# Patient Record
Sex: Male | Born: 1995 | Race: White | Hispanic: No | Marital: Single | Smoking: Current every day smoker
Health system: Southern US, Community
[De-identification: ages and names within clinical notes are randomized; demographics above are authoritative.]

## PROBLEM LIST (undated history)

## (undated) DIAGNOSIS — G4700 Insomnia, unspecified: Secondary | ICD-10-CM

## (undated) DIAGNOSIS — F39 Unspecified mood [affective] disorder: Secondary | ICD-10-CM

## (undated) DIAGNOSIS — T7840XA Allergy, unspecified, initial encounter: Secondary | ICD-10-CM

## (undated) HISTORY — DX: Allergy, unspecified, initial encounter: T78.40XA

## (undated) HISTORY — DX: Insomnia, unspecified: G47.00

## (undated) HISTORY — PX: OTHER SURGICAL HISTORY: SHX169

## (undated) HISTORY — PX: ADENOIDECTOMY: SHX5191

## (undated) HISTORY — DX: Unspecified mood (affective) disorder: F39

---
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
## 2016-01-25 ENCOUNTER — Encounter: Payer: Self-pay | Admitting: Family Medicine

## 2016-01-25 ENCOUNTER — Ambulatory Visit (INDEPENDENT_AMBULATORY_CARE_PROVIDER_SITE_OTHER): Payer: Managed Care, Other (non HMO) | Admitting: Family Medicine

## 2016-01-25 ENCOUNTER — Ambulatory Visit (INDEPENDENT_AMBULATORY_CARE_PROVIDER_SITE_OTHER): Payer: Managed Care, Other (non HMO)

## 2016-01-25 VITALS — BP 108/61 | HR 54 | Temp 98.4°F | Ht 73.5 in | Wt 200.0 lb

## 2016-01-25 DIAGNOSIS — M545 Low back pain, unspecified: Secondary | ICD-10-CM

## 2016-01-25 DIAGNOSIS — J45909 Unspecified asthma, uncomplicated: Secondary | ICD-10-CM | POA: Diagnosis not present

## 2016-01-25 DIAGNOSIS — F39 Unspecified mood [affective] disorder: Secondary | ICD-10-CM | POA: Diagnosis not present

## 2016-01-25 MED ORDER — ALBUTEROL SULFATE HFA 108 (90 BASE) MCG/ACT IN AERS: 1.0000 | INHALATION_SPRAY | Freq: Four times a day (QID) | RESPIRATORY_TRACT | Status: AC | PRN

## 2016-01-25 MED ORDER — CARBAMAZEPINE ER 100 MG PO CP12: 200.0000 mg | ORAL_CAPSULE | Freq: Two times a day (BID) | ORAL | Status: DC

## 2016-01-25 MED ORDER — RISPERIDONE 0.5 MG PO TABS: 0.5000 mg | ORAL_TABLET | Freq: Two times a day (BID) | ORAL | Status: DC

## 2016-01-25 MED ORDER — QUETIAPINE FUMARATE 25 MG PO TABS: 25.0000 mg | ORAL_TABLET | Freq: Every evening | ORAL | Status: DC | PRN

## 2016-01-25 NOTE — Addendum Note (Signed)
Addended by: Arville CareETTINGER, Adaja Wander on: 01/25/2016 10:19 AM   Modules accepted: Orders

## 2016-01-25 NOTE — Progress Notes (Addendum)
BP 108/61 mmHg  Pulse 54  Temp(Src) 98.4 F (36.9 C) (Oral)  Ht 6' 1.5" (1.867 m)  Wt 200 lb (90.719 kg)  BMI 26.03 kg/m2   Subjective:    Patient ID: Thomas Garrett, male    DOB: 1996-03-03, 20 y.o.   MRN: [id number]  HPI: Thomas Garrett is a 20 y.o. male presenting on 01/25/2016 for Establish Care   HPI Mood disorder Patient is coming in to establish care with our office today. He is coming in for a mood disorder. 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 He denies any suicidal ideations or thoughts of hurting himself. He is still having difficulty sleeping at night. He is currently taking carbamazepine and Risperdal and Seroquel. He does not have a psychiatrist currently but we will do a referral for one. He lives down in [place] so we will had done that direction. He says he has been a lot more stable on his medications and has been doing better now that he is away from where he was in [place] where he had the drug issues.  Mild reactive airways disease Over the past couple months he has had cough and what he feels like her wheezing is been using her albuterol inhaler. He typically has to use it about once a day. He denies having any asthma or breathing related problems when he was younger. He denies any fevers or chills or shortness of breath or wheezing. Is also had some intermittent nasal congestion and sinus congestion. This could be related to allergies but is not currently taking an allergy medication and recommended for him to go pick one up.  Low back pain midline Patient has midline low back pain that has been going on for the past few months. 8 comes and it goes in better and worsens. He says prolonged standing or prolonged sitting makes it worse. He doesn't know of anything that makes it better  at this point. He denies any fevers or chills or numbness or tingling going down either leg. He denies any overlying skin changes.  Relevant past medical, surgical, family and social history reviewed and updated as indicated. Interim medical history since our last visit reviewed. Allergies and medications reviewed and updated.  Review of Systems  Constitutional: Negative for fever.  HENT: Negative for congestion, ear discharge and ear pain.   Eyes: Negative for discharge and visual disturbance.  Respiratory: Positive for cough and wheezing. Negative for shortness of breath.   Cardiovascular: Negative for chest pain and leg swelling.  Gastrointestinal: Negative for abdominal pain, diarrhea and constipation.  Genitourinary: Negative for difficulty urinating.  Musculoskeletal: Positive for back pain and arthralgias. Negative for gait problem.  Skin: Negative for rash.  Neurological: Negative for dizziness, syncope, light-headedness and headaches.  Psychiatric/Behavioral: Positive for sleep disturbance, dysphoric mood and decreased concentration. Negative for suicidal ideas and self-injury. The patient is nervous/anxious.   All other systems reviewed and are negative.   Per HPI unless specifically indicated above  Social History   Social History  . Marital Status: Single    Spouse Name: N/A  . Number of Children: N/A  . Years of Education: N/A   Occupational History  . Not on file.   Social History Main Topics  . Smoking status: Former Smoker -- 1.00 packs/day for 4 years  Quit date: 12/08/2015  . Smokeless tobacco: Never Used  . Alcohol Use: No  . Drug Use: No     Comment: former addict cocaine, marijuana, alcohol, clean for 2 months  . Sexual Activity: Yes    Birth Control/ Protection: Condom   Other Topics Concern  . Not on file   Social History Narrative    Past Surgical History  Procedure Laterality Date  . Adenoidectomy    . Left leg      Family History    Problem Relation Age of Onset  . Cancer Maternal Grandmother     skin      Medication List       This list is accurate as of: 01/25/16  9:55 AM.  Always use your most recent med list.               albuterol 108 (90 Base) MCG/ACT inhaler  Commonly known as:  PROAIR HFA  Inhale 1 puff into the lungs every 6 (six) hours as needed for wheezing or shortness of breath.     carbamazepine 100 MG 12 hr capsule  Commonly known as:  CARBATROL  Take 2 capsules (200 mg total) by mouth 2 (two) times daily.     QUEtiapine 25 MG tablet  Commonly known as:  SEROQUEL  Take 1 tablet (25 mg total) by mouth at bedtime and may repeat dose one time if needed.     risperiDONE 0.5 MG tablet  Commonly known as:  RISPERDAL  Take 1 tablet (0.5 mg total) by mouth 2 (two) times daily.           Objective:    BP 108/61 mmHg  Pulse 54  Temp(Src) 98.4 F (36.9 C) (Oral)  Ht 6' 1.5" (1.867 m)  Wt 200 lb (90.719 kg)  BMI 26.03 kg/m2  Wt Readings from Last 3 Encounters:  01/25/16 200 lb (90.719 kg) (93 %*, Z = 1.51)  03/04/14 238 lb 12.8 oz (108.319 kg) (99 %*, Z = 2.49)  02/09/14 238 lb (107.956 kg) (99 %*, Z = 2.49)   * Growth percentiles are based on CDC 2-20 Years data.    Physical Exam  Constitutional: He is oriented to person, place, and time. He appears well-developed and well-nourished. No distress.  Eyes: Conjunctivae and EOM are normal. Pupils are equal, round, and reactive to light. Right eye exhibits no discharge. No scleral icterus.  Cardiovascular: Normal rate, regular rhythm, normal heart sounds and intact distal pulses.   No murmur heard. Pulmonary/Chest: Effort normal and breath sounds normal. No respiratory distress. He has no wheezes.  Musculoskeletal: Normal range of motion. He exhibits no edema.       Lumbar back: He exhibits tenderness (Midline low back pain around T12-L1. Negative straight leg raise bilaterally). He exhibits normal range of motion, no deformity and no  laceration.  Neurological: He is alert and oriented to person, place, and time. Coordination normal.  Skin: Skin is warm and dry. No rash noted. He is not diaphoretic.  Psychiatric: His speech is normal and behavior is normal. Judgment and thought content normal. His mood appears anxious. His affect is labile. He exhibits a depressed mood. He expresses no suicidal ideation. He expresses no suicidal plans.  Vitals reviewed.     Assessment & Plan:   Problem List Items Addressed This Visit      Respiratory   Mild reactive airways disease   Relevant Medications   albuterol (PROAIR HFA) 108 (90 Base) MCG/ACT inhaler  Other   Episodic mood disorder (HCC) - Primary   Relevant Medications   risperiDONE (RISPERDAL) 0.5 MG tablet   QUEtiapine (SEROQUEL) 25 MG tablet   carbamazepine (CARBATROL) 100 MG 12 hr capsule   Other Relevant Orders   Ambulatory referral to Psychiatry    Other Visit Diagnoses    Midline low back pain without sciatica        Relevant Orders    DG Lumbar Spine 2-3 Views        Follow up plan: Return in about 4 weeks (around 02/22/2016), or if symptoms worsen or fail to improve, for recheck anxiety.  Arville Care, MD [facility] 01/25/2016, 9:55 AM

## 2016-02-21 ENCOUNTER — Other Ambulatory Visit: Payer: Self-pay | Admitting: Family Medicine

## 2016-02-25 ENCOUNTER — Ambulatory Visit: Payer: Managed Care, Other (non HMO) | Admitting: Family Medicine

## 2016-03-04 ENCOUNTER — Ambulatory Visit (INDEPENDENT_AMBULATORY_CARE_PROVIDER_SITE_OTHER): Payer: Managed Care, Other (non HMO) | Admitting: Family Medicine

## 2016-03-04 ENCOUNTER — Encounter: Payer: Self-pay | Admitting: Family Medicine

## 2016-03-04 ENCOUNTER — Other Ambulatory Visit: Payer: Self-pay | Admitting: Family Medicine

## 2016-03-04 VITALS — BP 101/64 | HR 70 | Temp 97.0°F | Ht 73.5 in | Wt 201.4 lb

## 2016-03-04 DIAGNOSIS — F39 Unspecified mood [affective] disorder: Secondary | ICD-10-CM

## 2016-03-04 DIAGNOSIS — J309 Allergic rhinitis, unspecified: Secondary | ICD-10-CM

## 2016-03-04 DIAGNOSIS — F341 Dysthymic disorder: Secondary | ICD-10-CM | POA: Diagnosis not present

## 2016-03-04 DIAGNOSIS — F329 Major depressive disorder, single episode, unspecified: Secondary | ICD-10-CM

## 2016-03-04 MED ORDER — QUETIAPINE FUMARATE 100 MG PO TABS: 100.0000 mg | ORAL_TABLET | Freq: Every day | ORAL | Status: DC

## 2016-03-04 MED ORDER — CARBAMAZEPINE ER 100 MG PO CP12: ORAL_CAPSULE | ORAL | Status: AC

## 2016-03-04 MED ORDER — FLUTICASONE PROPIONATE 50 MCG/ACT NA SUSP: 2.0000 | Freq: Every day | NASAL | Status: AC

## 2016-03-04 NOTE — Progress Notes (Signed)
BP 101/64 mmHg  Pulse 70  Temp(Src) 97 F (36.1 C) (Oral)  Ht 6' 1.5" (1.867 m)  Wt 201 lb 6.4 oz (91.354 kg)  BMI 26.21 kg/m2  SpO2 99%   Subjective:    Patient ID: Thomas Garrett, male    DOB: 08/13/1996, 20 y.o.   MRN: [id number]  HPI: Thomas Garrett is a 20 y.o. male presenting on 03/04/2016 for Depression and GAD   HPI Depression and mood disorder Patient is coming in today for recheck on his depression and mood disorder. He is still in the intensive outpatient treatment and goes 5 days a week. 3 of those days he goes for group meetings and the other 2 he has one-on-one meetings. He says he has been feeling more depressed over the past few weeks. He does have an appointment to go see a psychiatrist in one month and was just coming back for refills during this meantime. He denies any suicidal ideations or thoughts of hurting himself. He has been having a lot of feelings of down and sadness. He denies any issues with sleeping or appetite.  Relevant past medical, surgical, family and social history reviewed and updated as indicated. Interim medical history since our last visit reviewed. Allergies and medications reviewed and updated.  Review of Systems  Constitutional: Negative for fever and chills.  HENT: Negative for ear discharge and ear pain.   Eyes: Negative for discharge and visual disturbance.  Respiratory: Negative for shortness of breath and wheezing.   Cardiovascular: Negative for chest pain and leg swelling.  Gastrointestinal: Negative for abdominal pain, diarrhea and constipation.  Genitourinary: Negative for difficulty urinating.  Musculoskeletal: Negative for back pain and gait problem.  Skin: Negative for rash.  Neurological: Negative for syncope, light-headedness and headaches.  Psychiatric/Behavioral: Positive for dysphoric mood. Negative for suicidal ideas, sleep disturbance, self-injury and decreased concentration. The patient is nervous/anxious.     All other systems reviewed and are negative.   Per HPI unless specifically indicated above     Medication List       This list is accurate as of: 03/04/16  9:45 AM.  Always use your most recent med list.               albuterol 108 (90 Base) MCG/ACT inhaler  Commonly known as:  PROAIR HFA  Inhale 1 puff into the lungs every 6 (six) hours as needed for wheezing or shortness of breath.     carbamazepine 100 MG 12 hr capsule  Commonly known as:  CARBATROL  TAKE 2 CAPSULES(200 MG) BY MOUTH TWICE DAILY     fluticasone 50 MCG/ACT nasal spray  Commonly known as:  FLONASE  Place 2 sprays into both nostrils daily.     QUEtiapine 100 MG tablet  Commonly known as:  SEROQUEL  Take 1 tablet (100 mg total) by mouth at bedtime.     risperiDONE 0.5 MG tablet  Commonly known as:  RISPERDAL  Take 1 tablet (0.5 mg total) by mouth 2 (two) times daily.           Objective:    BP 101/64 mmHg  Pulse 70  Temp(Src) 97 F (36.1 C) (Oral)  Ht 6' 1.5" (1.867 m)  Wt 201 lb 6.4 oz (91.354 kg)  BMI 26.21 kg/m2  SpO2 99%  Wt Readings from Last 3 Encounters:  03/04/16 201 lb 6.4 oz (91.354 kg) (94 %*, Z = 1.53)  01/25/16 200 lb (90.719 kg) (93 %*, Z = 1.51)  03/04/14 238 lb 12.8 oz (108.319 kg) (99 %*, Z = 2.49)   * Growth percentiles are based on CDC 2-20 Years data.    Physical Exam  Constitutional: He is oriented to person, place, and time. He appears well-developed and well-nourished. No distress.  Eyes: Conjunctivae and EOM are normal. Pupils are equal, round, and reactive to light. Right eye exhibits no discharge. No scleral icterus.  Neck: Neck supple. No thyromegaly present.  Cardiovascular: Normal rate, regular rhythm, normal heart sounds and intact distal pulses.   No murmur heard. Pulmonary/Chest: Effort normal and breath sounds normal. No respiratory distress. He has no wheezes.  Musculoskeletal: Normal range of motion. He exhibits no edema.  Lymphadenopathy:    He has  no cervical adenopathy.  Neurological: He is alert and oriented to person, place, and time. Coordination normal.  Skin: Skin is warm and dry. No rash noted. He is not diaphoretic.  Psychiatric: His behavior is normal. Judgment and thought content normal. His mood appears anxious. His affect is labile. He exhibits a depressed mood. He expresses no suicidal ideation. He expresses no suicidal plans.  Nursing note and vitals reviewed.     Assessment & Plan:   Problem List Items Addressed This Visit      Other   Depression, reactive   Relevant Medications   carbamazepine (CARBATROL) 100 MG 12 hr capsule   Episodic mood disorder (HCC) - Primary    Other Visit Diagnoses    Allergic rhinitis, unspecified allergic rhinitis type        Relevant Medications    fluticasone (FLONASE) 50 MCG/ACT nasal spray        Follow up plan: Return if symptoms worsen or fail to improve.  Counseling provided for all of the vaccine components No orders of the defined types were placed in this encounter.    Arville CareJoshua Deziya Amero, MD [facility] 03/04/2016, 9:45 AM

## 2016-03-11 ENCOUNTER — Other Ambulatory Visit: Payer: Self-pay | Admitting: Family Medicine

## 2016-03-21 ENCOUNTER — Ambulatory Visit: Payer: Managed Care, Other (non HMO) | Admitting: Family Medicine

## 2016-04-07 ENCOUNTER — Other Ambulatory Visit: Payer: Self-pay | Admitting: Family Medicine

## 2016-04-07 DIAGNOSIS — F39 Unspecified mood [affective] disorder: Secondary | ICD-10-CM

## 2016-04-30 ENCOUNTER — Telehealth: Payer: Self-pay | Admitting: Family Medicine

## 2016-04-30 DIAGNOSIS — F39 Unspecified mood [affective] disorder: Secondary | ICD-10-CM

## 2016-05-02 NOTE — Telephone Encounter (Signed)
We can go ahead and put in the referral again, we did try to put in the referral but then the patient when they received a call said he did not want to go the first time. But yes go ahead and put it back in and we'll try again.

## 2016-05-02 NOTE — Telephone Encounter (Signed)
[name] aware - referral ordered for Dr Sandria ManlyLove

## 2016-06-16 ENCOUNTER — Ambulatory Visit (INDEPENDENT_AMBULATORY_CARE_PROVIDER_SITE_OTHER): Payer: Managed Care, Other (non HMO) | Admitting: Family Medicine

## 2016-06-16 ENCOUNTER — Encounter: Payer: Self-pay | Admitting: Family Medicine

## 2016-06-16 VITALS — BP 112/64 | HR 75 | Temp 99.5°F | Ht 73.5 in | Wt 211.5 lb

## 2016-06-16 DIAGNOSIS — A09 Infectious gastroenteritis and colitis, unspecified: Secondary | ICD-10-CM

## 2016-06-16 DIAGNOSIS — R197 Diarrhea, unspecified: Secondary | ICD-10-CM

## 2016-06-16 MED ORDER — CIPROFLOXACIN HCL 500 MG PO TABS: 500.0000 mg | ORAL_TABLET | Freq: Two times a day (BID) | ORAL | 0 refills | Status: AC

## 2016-06-16 NOTE — Progress Notes (Signed)
BP 112/64   Pulse 75   Temp 99.5 F (37.5 C) (Oral)   Ht 6' 1.5" (1.867 m)   Wt 211 lb 8 oz (95.9 kg)   BMI 27.53 kg/m    Subjective:    Patient ID: Thomas Garrett, male    DOB: 1996-01-20, 20 y.o.   MRN: [id number]  HPI: Thomas Garrett is a 20 y.o. male presenting on 06/16/2016 for Diarrhea & Nausea (x 4 days; taking Pepto Bismol)   HPI Diarrhea and nausea Patient has been having diarrhea and nausea and is been going on for the past 4 days. He says he is having looser stools about 4-6 times daily and then he has been having some nausea but no vomiting and just has not really felt like eating. He has been trying to make sure that he stays hydrated well. He has tried Pepto-Bismol over the past 4 days but does not seem to be helping. He denies any fevers or chills although he does have a temperature of 99.5. The office today. He denies any sick contacts that he knows of or any recent travel. He denies any blood in his stool and his abdominal pain has been lower abdominal and dull and achy and low-grade.  Relevant past medical, surgical, family and social history reviewed and updated as indicated. Interim medical history since our last visit reviewed. Allergies and medications reviewed and updated.  Review of Systems  Constitutional: Negative for chills and fever.  Eyes: Negative for discharge.  Respiratory: Negative for shortness of breath and wheezing.   Cardiovascular: Negative for chest pain and leg swelling.  Gastrointestinal: Positive for abdominal pain, diarrhea and nausea. Negative for abdominal distention, anal bleeding, blood in stool, constipation and vomiting.  Genitourinary: Negative for decreased urine volume, difficulty urinating, dysuria, frequency, genital sores and urgency.  Musculoskeletal: Negative for back pain and gait problem.  Skin: Negative for rash.  All other systems reviewed and are negative.   Per HPI unless specifically indicated above       Medication List       Accurate as of 06/16/16  3:12 PM. Always use your most recent med list.          albuterol 108 (90 Base) MCG/ACT inhaler Commonly known as:  PROAIR HFA Inhale 1 puff into the lungs every 6 (six) hours as needed for wheezing or shortness of breath.   carbamazepine 100 MG 12 hr capsule Commonly known as:  CARBATROL TAKE 2 CAPSULES(200 MG) BY MOUTH TWICE DAILY   ciprofloxacin 500 MG tablet Commonly known as:  CIPRO Take 1 tablet (500 mg total) by mouth 2 (two) times daily.   fluticasone 50 MCG/ACT nasal spray Commonly known as:  FLONASE Place 2 sprays into both nostrils daily.   QUEtiapine 100 MG tablet Commonly known as:  SEROQUEL TAKE 1 TABLET(100 MG) BY MOUTH AT BEDTIME   QUEtiapine 25 MG tablet Commonly known as:  SEROQUEL TAKE 1 TABLET(25 MG) BY MOUTH AT BEDTIME. MAY REPEAT DOSE 1 TIME AS NEEDED   risperiDONE 0.5 MG tablet Commonly known as:  RISPERDAL TAKE 1 TABLET(0.5 MG) BY MOUTH TWICE DAILY          Objective:    BP 112/64   Pulse 75   Temp 99.5 F (37.5 C) (Oral)   Ht 6' 1.5" (1.867 m)   Wt 211 lb 8 oz (95.9 kg)   BMI 27.53 kg/m   Wt Readings from Last 3 Encounters:  06/16/16 211 lb 8 oz (  95.9 kg) (96 %, Z= 1.73)*  03/04/16 201 lb 6.4 oz (91.4 kg) (94 %, Z= 1.53)*  01/25/16 200 lb (90.7 kg) (93 %, Z= 1.51)*   * Growth percentiles are based on CDC 2-20 Years data.    Physical Exam  Constitutional: He is oriented to person, place, and time. He appears well-developed and well-nourished. No distress.  Eyes: Conjunctivae are normal. Right eye exhibits no discharge. No scleral icterus.  Cardiovascular: Normal rate, regular rhythm, normal heart sounds and intact distal pulses.   No murmur heard. Pulmonary/Chest: Effort normal and breath sounds normal. No respiratory distress. He has no wheezes. He has no rales.  Abdominal: Soft. Bowel sounds are normal. He exhibits no distension. There is no hepatosplenomegaly. There is tenderness  (Suprapubic abdominal pain) in the suprapubic area. There is no rigidity, no rebound, no guarding and no CVA tenderness.  Musculoskeletal: Normal range of motion. He exhibits no edema.  Neurological: He is alert and oriented to person, place, and time. Coordination normal.  Skin: Skin is warm and dry. No rash noted. He is not diaphoretic.  Psychiatric: He has a normal mood and affect. His behavior is normal.  Nursing note and vitals reviewed.     Assessment & Plan:   Problem List Items Addressed This Visit    None    Visit Diagnoses    Diarrhea of presumed infectious origin    -  Primary   Will try Imodium first, sent Cipro as a possibility for something that may help. If he develop any fevers or bloody stool come back   Relevant Medications   ciprofloxacin (CIPRO) 500 MG tablet       Follow up plan: Return if symptoms worsen or fail to improve.  Counseling provided for all of the vaccine components No orders of the defined types were placed in this encounter.   Arville Care, MD [facility] 06/16/2016, 3:12 PM

## 2016-08-26 ENCOUNTER — Telehealth: Payer: Self-pay | Admitting: Family Medicine

## 2016-09-09 ENCOUNTER — Emergency Department (HOSPITAL_COMMUNITY)
Admission: EM | Admit: 2016-09-09 | Discharge: 2016-09-09 | Discharge status: Absent without leave | Payer: Managed Care, Other (non HMO)

## 2016-09-15 NOTE — Telephone Encounter (Signed)
Pt was seen at a walk in clinic 09/02/16 and was seen in ED 09/09/2016, will close encounter.

## 2016-11-20 IMAGING — DX DG LUMBAR SPINE 2-3V
2 series · 2 of 2 positions shown · non-contrast
Comparison: None.

CLINICAL DATA: Low back pain after lifting weights. Initial
encounter.

EXAM:
LUMBAR SPINE - 2-3 VIEW

[l-spine ap]
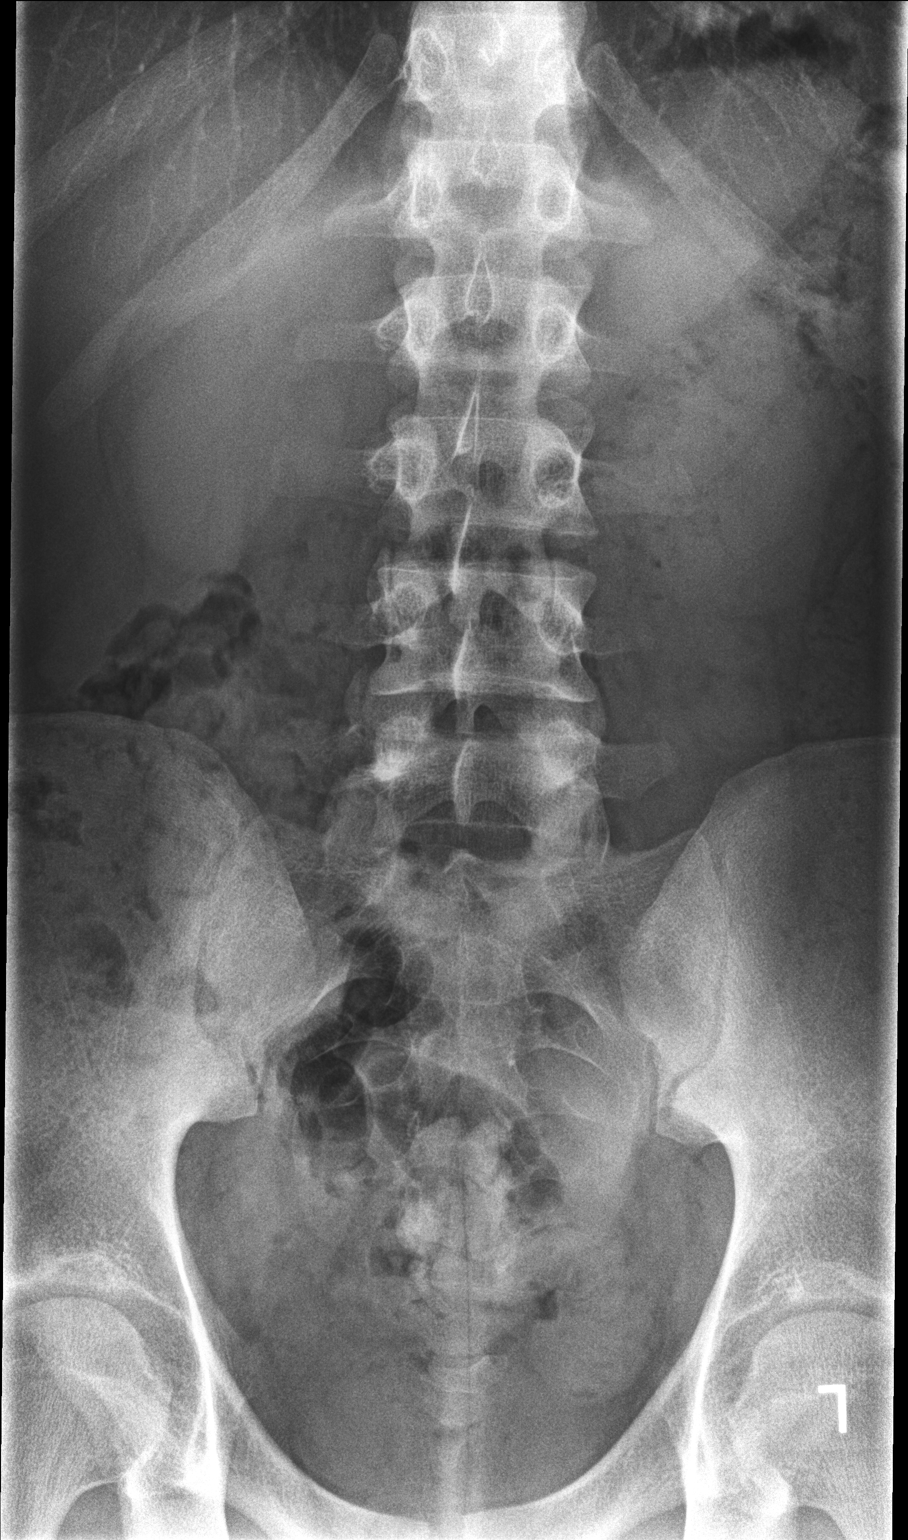

[l-spine lat]
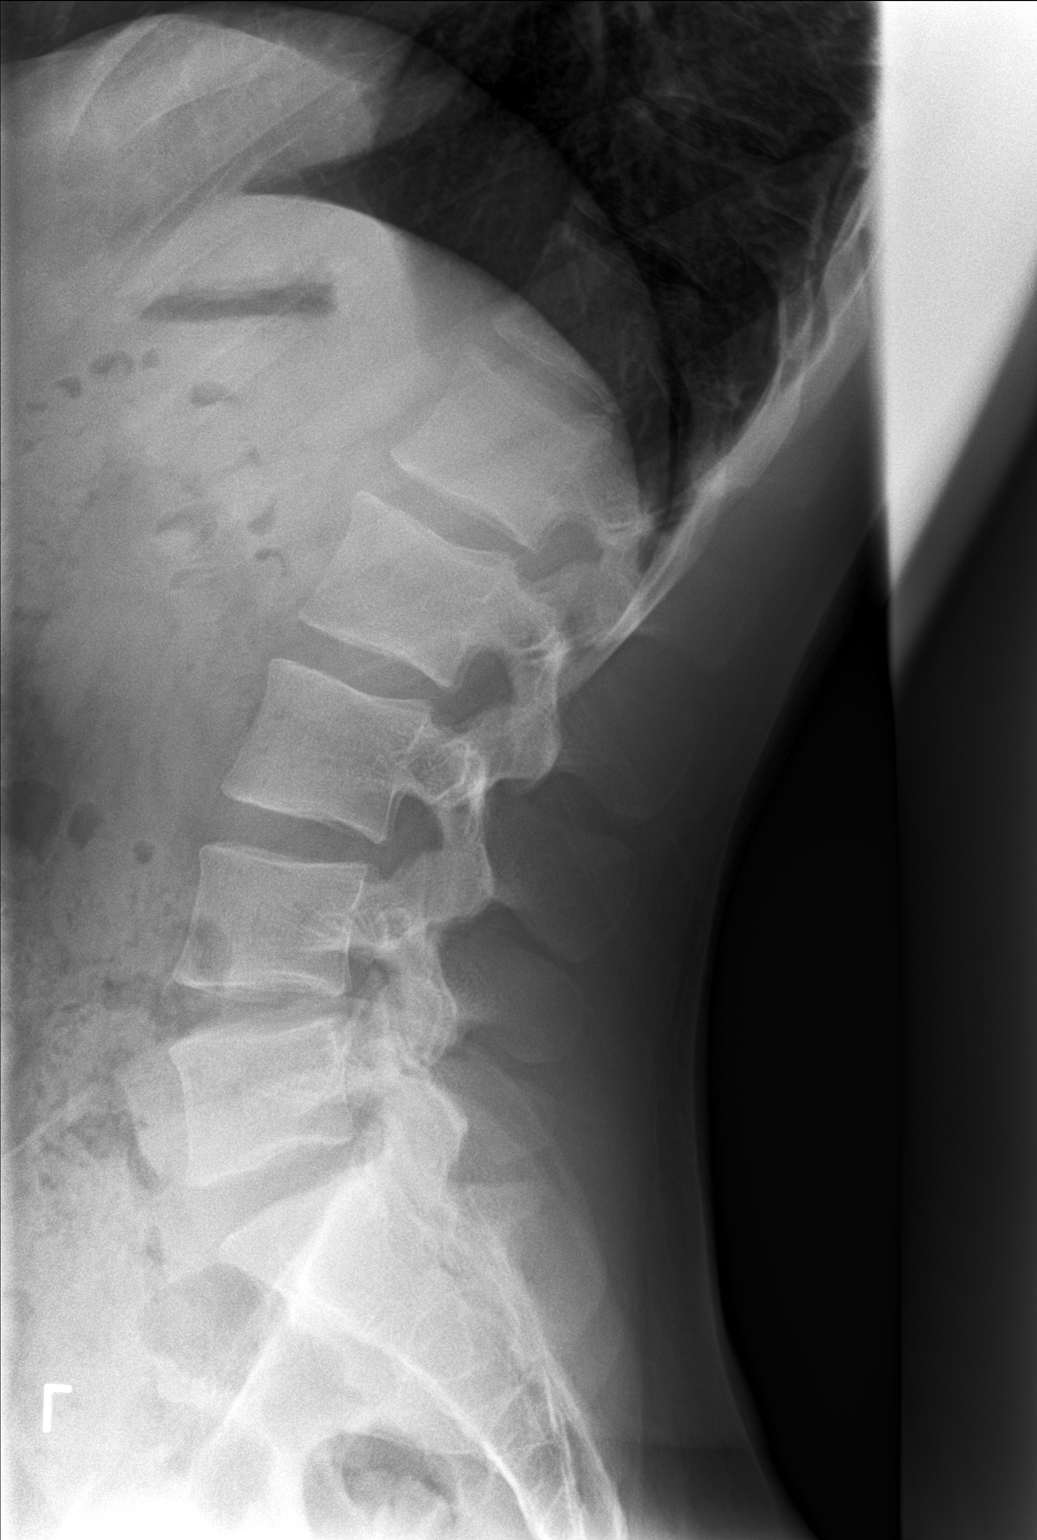

[2 of 2 positions shown; findings below may reference images not displayed]

FINDINGS: There is no evidence of lumbar spine fracture. Alignment is normal.
Mild disc space narrowing seen at L4-5. Other intervertebral disc
spaces are maintained. No other bone abnormality identified.
IMPRESSION: No acute findings.  Mild L4-5 degenerative disc space narrowing.
